# Patient Record
Sex: Male | Born: 1965 | Race: Black or African American | Hispanic: No | Marital: Single | Smoking: Never smoker
Health system: Southern US, Community
[De-identification: ages and names within clinical notes are randomized; demographics above are authoritative.]

## PROBLEM LIST (undated history)

## (undated) DIAGNOSIS — I10 Essential (primary) hypertension: Secondary | ICD-10-CM

---

## 2020-01-09 ENCOUNTER — Other Ambulatory Visit: Payer: Self-pay

## 2020-01-09 ENCOUNTER — Emergency Department
Admission: EM | Admit: 2020-01-09 | Discharge: 2020-01-09 | Disposition: A | Discharge status: Home / Self Care | Payer: BC Managed Care – PPO | Attending: Emergency Medicine | Admitting: Emergency Medicine

## 2020-01-09 ENCOUNTER — Emergency Department: Payer: BC Managed Care – PPO

## 2020-01-09 ENCOUNTER — Encounter: Payer: Self-pay | Admitting: Emergency Medicine

## 2020-01-09 DIAGNOSIS — Y92410 Unspecified street and highway as the place of occurrence of the external cause: Secondary | ICD-10-CM | POA: Insufficient documentation

## 2020-01-09 DIAGNOSIS — R609 Edema, unspecified: Secondary | ICD-10-CM

## 2020-01-09 DIAGNOSIS — S93402A Sprain of unspecified ligament of left ankle, initial encounter: Secondary | ICD-10-CM | POA: Insufficient documentation

## 2020-01-09 DIAGNOSIS — I10 Essential (primary) hypertension: Secondary | ICD-10-CM | POA: Insufficient documentation

## 2020-01-09 DIAGNOSIS — Y93I9 Activity, other involving external motion: Secondary | ICD-10-CM | POA: Insufficient documentation

## 2020-01-09 DIAGNOSIS — Y999 Unspecified external cause status: Secondary | ICD-10-CM | POA: Diagnosis not present

## 2020-01-09 DIAGNOSIS — S99912A Unspecified injury of left ankle, initial encounter: Secondary | ICD-10-CM | POA: Diagnosis present

## 2020-01-09 HISTORY — DX: Essential (primary) hypertension: I10

## 2020-01-09 MED ORDER — NAPROXEN 500 MG PO TABS: 500.0000 mg | ORAL_TABLET | Freq: Two times a day (BID) | ORAL | Status: AC

## 2020-01-09 NOTE — ED Notes (Signed)
Called patient no answer patient in x-ray ,x-ray will bring patient to room 41 in flex
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
## 2020-01-09 NOTE — Discharge Instructions (Addendum)
Follow discharge care instruction ambulate with support for 2 to 3 days as needed.
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
